# Patient Record
Sex: Female | Born: 1960 | Race: Black or African American | Hispanic: No | Smoking: Never smoker
Health system: Southern US, Community
[De-identification: ages and names within clinical notes are randomized; demographics above are authoritative.]

## PROBLEM LIST (undated history)

## (undated) DIAGNOSIS — I10 Essential (primary) hypertension: Secondary | ICD-10-CM

## (undated) DIAGNOSIS — E079 Disorder of thyroid, unspecified: Secondary | ICD-10-CM

## (undated) DIAGNOSIS — E119 Type 2 diabetes mellitus without complications: Secondary | ICD-10-CM

## (undated) DIAGNOSIS — C569 Malignant neoplasm of unspecified ovary: Secondary | ICD-10-CM

## (undated) HISTORY — PX: ABDOMINAL SURGERY: SHX537

## (undated) HISTORY — PX: HERNIA REPAIR: SHX51

## (undated) HISTORY — PX: ABDOMINAL HYSTERECTOMY: SHX81

---

## 2015-09-22 ENCOUNTER — Emergency Department (HOSPITAL_COMMUNITY): Payer: Medicare Other

## 2015-09-22 ENCOUNTER — Encounter (HOSPITAL_COMMUNITY): Payer: Self-pay | Admitting: Emergency Medicine

## 2015-09-22 ENCOUNTER — Observation Stay (HOSPITAL_COMMUNITY)
Admission: EM | Admit: 2015-09-22 | Discharge: 2015-09-23 | Discharge status: Against Medical Advice | Payer: Medicare Other | Attending: Internal Medicine | Admitting: Internal Medicine

## 2015-09-22 DIAGNOSIS — C569 Malignant neoplasm of unspecified ovary: Secondary | ICD-10-CM | POA: Diagnosis not present

## 2015-09-22 DIAGNOSIS — E079 Disorder of thyroid, unspecified: Secondary | ICD-10-CM | POA: Diagnosis not present

## 2015-09-22 DIAGNOSIS — K5669 Other intestinal obstruction: Secondary | ICD-10-CM | POA: Diagnosis not present

## 2015-09-22 DIAGNOSIS — D649 Anemia, unspecified: Secondary | ICD-10-CM | POA: Insufficient documentation

## 2015-09-22 DIAGNOSIS — E119 Type 2 diabetes mellitus without complications: Secondary | ICD-10-CM | POA: Diagnosis not present

## 2015-09-22 DIAGNOSIS — E785 Hyperlipidemia, unspecified: Secondary | ICD-10-CM

## 2015-09-22 DIAGNOSIS — R110 Nausea: Secondary | ICD-10-CM

## 2015-09-22 DIAGNOSIS — Z9221 Personal history of antineoplastic chemotherapy: Secondary | ICD-10-CM | POA: Insufficient documentation

## 2015-09-22 DIAGNOSIS — K5660 Unspecified intestinal obstruction: Principal | ICD-10-CM | POA: Insufficient documentation

## 2015-09-22 DIAGNOSIS — I10 Essential (primary) hypertension: Secondary | ICD-10-CM | POA: Diagnosis not present

## 2015-09-22 DIAGNOSIS — E871 Hypo-osmolality and hyponatremia: Secondary | ICD-10-CM | POA: Insufficient documentation

## 2015-09-22 DIAGNOSIS — Z79899 Other long term (current) drug therapy: Secondary | ICD-10-CM | POA: Insufficient documentation

## 2015-09-22 DIAGNOSIS — R1013 Epigastric pain: Secondary | ICD-10-CM

## 2015-09-22 DIAGNOSIS — K56609 Unspecified intestinal obstruction, unspecified as to partial versus complete obstruction: Secondary | ICD-10-CM | POA: Diagnosis present

## 2015-09-22 HISTORY — DX: Disorder of thyroid, unspecified: E07.9

## 2015-09-22 HISTORY — DX: Type 2 diabetes mellitus without complications: E11.9

## 2015-09-22 HISTORY — DX: Essential (primary) hypertension: I10

## 2015-09-22 HISTORY — DX: Malignant neoplasm of unspecified ovary: C56.9

## 2015-09-22 LAB — COMPREHENSIVE METABOLIC PANEL
ALK PHOS: 76 U/L (ref 38–126)
ALT: 17 U/L (ref 14–54)
ANION GAP: 9 (ref 5–15)
AST: 21 U/L (ref 15–41)
Albumin: 3.9 g/dL (ref 3.5–5.0)
BUN: 12 mg/dL (ref 6–20)
CALCIUM: 9.3 mg/dL (ref 8.9–10.3)
CO2: 28 mmol/L (ref 22–32)
CREATININE: 0.84 mg/dL (ref 0.44–1.00)
Chloride: 95 mmol/L — ABNORMAL LOW (ref 101–111)
Glucose, Bld: 264 mg/dL — ABNORMAL HIGH (ref 65–99)
Potassium: 4 mmol/L (ref 3.5–5.1)
SODIUM: 132 mmol/L — AB (ref 135–145)
TOTAL PROTEIN: 7.5 g/dL (ref 6.5–8.1)
Total Bilirubin: 0.4 mg/dL (ref 0.3–1.2)

## 2015-09-22 LAB — URINALYSIS, ROUTINE W REFLEX MICROSCOPIC
Bilirubin Urine: NEGATIVE
Glucose, UA: >1000 mg/dL — AB
HGB URINE DIPSTICK: NEGATIVE
Ketones, ur: 15 mg/dL — AB
LEUKOCYTES UA: NEGATIVE
NITRITE: NEGATIVE
PROTEIN: NEGATIVE mg/dL
SPECIFIC GRAVITY, URINE: 1.021 (ref 1.005–1.030)
pH: 6 (ref 5.0–8.0)

## 2015-09-22 LAB — CBC
HCT: 28.5 % — ABNORMAL LOW (ref 36.0–46.0)
HEMOGLOBIN: 9.4 g/dL — AB (ref 12.0–15.0)
MCH: 28.1 pg (ref 26.0–34.0)
MCHC: 33 g/dL (ref 30.0–36.0)
MCV: 85.1 fL (ref 78.0–100.0)
PLATELETS: 387 10*3/uL (ref 150–400)
RBC: 3.35 MIL/uL — AB (ref 3.87–5.11)
RDW: 14.8 % (ref 11.5–15.5)
WBC: 10.3 10*3/uL (ref 4.0–10.5)

## 2015-09-22 LAB — URINE MICROSCOPIC-ADD ON: RBC / HPF: NONE SEEN RBC/hpf (ref 0–5)

## 2015-09-22 LAB — LIPASE, BLOOD: LIPASE: 14 U/L (ref 11–51)

## 2015-09-22 LAB — GLUCOSE, CAPILLARY: Glucose-Capillary: 199 mg/dL — ABNORMAL HIGH (ref 65–99)

## 2015-09-22 LAB — TROPONIN I

## 2015-09-22 MED ORDER — SODIUM CHLORIDE 0.9 % IV BOLUS (SEPSIS)
1000.0000 mL | Freq: Once | INTRAVENOUS | Status: AC
  Administered 2015-09-22: 1000 mL via INTRAVENOUS

## 2015-09-22 MED ORDER — PROMETHAZINE HCL 25 MG/ML IJ SOLN
25.0000 mg | Freq: Once | INTRAMUSCULAR | Status: AC
  Administered 2015-09-22: 25 mg via INTRAVENOUS
  Filled 2015-09-22: qty 1

## 2015-09-22 MED ORDER — PROMETHAZINE HCL 25 MG/ML IJ SOLN
12.5000 mg | Freq: Four times a day (QID) | INTRAMUSCULAR | Status: DC | PRN
  Administered 2015-09-22: 12.5 mg via INTRAVENOUS
  Filled 2015-09-22: qty 1

## 2015-09-22 MED ORDER — INSULIN ASPART 100 UNIT/ML ~~LOC~~ SOLN: 0.0000 [IU] | Freq: Three times a day (TID) | SUBCUTANEOUS | Status: DC

## 2015-09-22 MED ORDER — ONDANSETRON 8 MG PO TBDP
8.0000 mg | ORAL_TABLET | Freq: Once | ORAL | Status: AC
  Administered 2015-09-22: 8 mg via ORAL
  Filled 2015-09-22: qty 1

## 2015-09-22 MED ORDER — HYDROMORPHONE HCL 1 MG/ML IJ SOLN
0.5000 mg | INTRAMUSCULAR | Status: DC | PRN
  Administered 2015-09-23: 1 mg via INTRAVENOUS
  Filled 2015-09-22: qty 1

## 2015-09-22 MED ORDER — KETOROLAC TROMETHAMINE 15 MG/ML IJ SOLN
15.0000 mg | Freq: Four times a day (QID) | INTRAMUSCULAR | Status: DC | PRN
  Administered 2015-09-22 – 2015-09-23 (×2): 15 mg via INTRAVENOUS
  Filled 2015-09-22 (×2): qty 1

## 2015-09-22 MED ORDER — ENOXAPARIN SODIUM 40 MG/0.4ML ~~LOC~~ SOLN
40.0000 mg | SUBCUTANEOUS | Status: DC
  Administered 2015-09-22: 40 mg via SUBCUTANEOUS
  Filled 2015-09-22: qty 0.4

## 2015-09-22 MED ORDER — DIATRIZOATE MEGLUMINE & SODIUM 66-10 % PO SOLN
15.0000 mL | Freq: Once | ORAL | Status: AC
  Administered 2015-09-22: 15 mL via ORAL

## 2015-09-22 MED ORDER — SODIUM CHLORIDE 0.9% FLUSH: 10.0000 mL | INTRAVENOUS | Status: DC | PRN

## 2015-09-22 MED ORDER — IOPAMIDOL (ISOVUE-300) INJECTION 61%: 100.0000 mL | Freq: Once | INTRAVENOUS | Status: DC | PRN

## 2015-09-22 MED ORDER — HYDROMORPHONE HCL 1 MG/ML IJ SOLN
1.0000 mg | Freq: Once | INTRAMUSCULAR | Status: AC
  Administered 2015-09-22: 1 mg via INTRAVENOUS
  Filled 2015-09-22: qty 1

## 2015-09-22 MED ORDER — MORPHINE SULFATE (PF) 4 MG/ML IV SOLN
4.0000 mg | Freq: Once | INTRAVENOUS | Status: AC
  Administered 2015-09-22: 4 mg via INTRAVENOUS
  Filled 2015-09-22: qty 1

## 2015-09-22 MED ORDER — FENTANYL CITRATE (PF) 100 MCG/2ML IJ SOLN
50.0000 ug | INTRAMUSCULAR | Status: DC | PRN
  Administered 2015-09-22: 50 ug via NASAL
  Filled 2015-09-22: qty 2

## 2015-09-22 MED ORDER — SODIUM CHLORIDE 0.9 % IV SOLN
INTRAVENOUS | Status: DC
  Administered 2015-09-22 – 2015-09-23 (×2): via INTRAVENOUS

## 2015-09-22 MED ORDER — METOCLOPRAMIDE HCL 5 MG/ML IJ SOLN
10.0000 mg | Freq: Once | INTRAMUSCULAR | Status: AC
  Administered 2015-09-22: 10 mg via INTRAVENOUS
  Filled 2015-09-22: qty 2

## 2015-09-22 NOTE — ED Notes (Signed)
Patient transported to CT 

## 2015-09-22 NOTE — ED Provider Notes (Signed)
[facility] DEPT Provider Note   CSN: [id number] Arrival date & time: 09/22/15  0408     History   Chief Complaint Chief Complaint  Patient presents with  . Abdominal Pain    HPI Danielle Flowers is a 55 y.o. female.  HPI   Danielle Flowers is a 55 y.o. female, with a history of ovarian cancer, DM, and hypertension, presenting to the ED with epigastric abdominal pain beginning around 3 AM this morning. Pain is cramping "like labor pains," rated 9/10, nonradiating. Pt also endorses nausea and two episodes of emesis over the last 24 hours. Has tried maalox with no relief. Last BM yesterday. Pt endorses two yesterday, one normal and then one that was watery. Pt states she has had similar pain and symptoms before, and has had a previous bowel obstruction. Pt has not had a BM since the pain began and fears she may be obstructed. Denies fever/chills, hematochezia/melena, chest pain, shortness of breath, or any other complaints. Pt has stage III ovarian cancer and is receiving chemo therapy with last treatment in March 2017. Patient is visiting here from [place] for a convention.  Past Medical History:  Diagnosis Date  . Diabetes mellitus without complication ([place])   . Hypertension   . Ovarian cancer ([place])   . Thyroid disease     Patient Active Problem List   Diagnosis Date Noted  . Small bowel obstruction ([place]) 09/22/2015  . Diabetes mellitus ([place]) 09/22/2015  . Ovarian cancer ([place]) 09/22/2015  . Hyperlipidemia 09/22/2015    Past Surgical History:  Procedure Laterality Date  . ABDOMINAL HYSTERECTOMY    . ABDOMINAL SURGERY    . HERNIA REPAIR      OB History    No data available       Home Medications    Prior to Admission medications   Medication Sig Start Date End Date Taking? Authorizing Provider  alum & mag hydroxide-simeth (MAALOX/MYLANTA) 200-200-20 MG/5ML suspension Take 30 mLs by mouth every 6 (six) hours as needed for indigestion or heartburn.   Yes Historical  Provider, MD  HYDROcodone-acetaminophen (NORCO) 10-325 MG tablet Take 1 tablet by mouth as needed. 09/09/15  Yes Historical Provider, MD  pravastatin (PRAVACHOL) 40 MG tablet Take 1 tablet by mouth daily. 06/15/15  Yes Historical Provider, MD  promethazine (PHENERGAN) 12.5 MG tablet Take 1 tablet by mouth every 8 (eight) hours as needed. 09/02/15  Yes Historical Provider, MD  ranitidine (ZANTAC) 150 MG tablet Take 1 tablet by mouth 2 (two) times daily. 08/23/15  Yes Historical Provider, MD  sucralfate (CARAFATE) 1 g tablet Take 1 tablet by mouth 4 (four) times daily. 09/17/15  Yes Historical Provider, MD    Family History History reviewed. No pertinent family history.  Social History Social History  Substance Use Topics  . Smoking status: Never Smoker  . Smokeless tobacco: Never Used  . Alcohol use No     Allergies   Review of patient's allergies indicates no known allergies.   Review of Systems Review of Systems  Constitutional: Negative for chills, diaphoresis and fever.  Respiratory: Negative for shortness of breath.   Cardiovascular: Negative for chest pain.  Gastrointestinal: Positive for abdominal pain, diarrhea, nausea and vomiting. Negative for blood in stool.  Genitourinary: Negative for dysuria and hematuria.  All other systems reviewed and are negative.    Physical Exam Updated Vital Signs BP 136/83 (BP Location: Left Arm)   Pulse 110   Temp 98.8 F (37.1 C) (Oral)   Resp 18  Ht 5\' 7"  (1.702 m)   Wt 95.3 kg   SpO2 96%   BMI 32.89 kg/m   Physical Exam  Constitutional: She appears well-developed and well-nourished. No distress.  HENT:  Head: Normocephalic and atraumatic.  Eyes: Conjunctivae are normal.  Neck: Neck supple.  Cardiovascular: Normal rate, regular rhythm, normal heart sounds and intact distal pulses.   Pulmonary/Chest: Effort normal and breath sounds normal. No respiratory distress.  Abdominal: Soft. She exhibits no distension. Bowel sounds are  decreased. There is tenderness in the epigastric area. There is no guarding.    Musculoskeletal: She exhibits no edema or tenderness.  Lymphadenopathy:    She has no cervical adenopathy.  Neurological: She is alert.  Skin: Skin is warm and dry. She is not diaphoretic.  Psychiatric: She has a normal mood and affect. Her behavior is normal.  Nursing note and vitals reviewed.    ED Treatments / Results  Labs (all labs ordered are listed, but only abnormal results are displayed) Labs Reviewed  COMPREHENSIVE METABOLIC PANEL - Abnormal; Notable for the following:       Result Value   Sodium 132 (*)    Chloride 95 (*)    Glucose, Bld 264 (*)    All other components within normal limits  CBC - Abnormal; Notable for the following:    RBC 3.35 (*)    Hemoglobin 9.4 (*)    HCT 28.5 (*)    All other components within normal limits  URINALYSIS, ROUTINE W REFLEX MICROSCOPIC (NOT AT [facility]) - Abnormal; Notable for the following:    Glucose, UA >1000 (*)    Ketones, ur 15 (*)    All other components within normal limits  URINE MICROSCOPIC-ADD ON - Abnormal; Notable for the following:    Squamous Epithelial / LPF 0-5 (*)    Bacteria, UA RARE (*)    All other components within normal limits  LIPASE, BLOOD  TROPONIN I    EKG  EKG Interpretation  Date/Time:  Wednesday September 22 2015 06:41:31 EDT Ventricular Rate:  103 PR Interval:    QRS Duration: 85 QT Interval:  326 QTC Calculation: 427 R Axis:   34 Text Interpretation:  Sinus tachycardia Abnormal R-wave progression, early transition No old tracing to compare Confirmed by Glynn Octave [phone]) on 09/22/2015 6:53:16 AM       Radiology Ct Abdomen Pelvis Wo Contrast  Result Date: 09/22/2015 CLINICAL DATA:  Epigastric pain since 3 p.m. yesterday with 2 episodes of vomiting. History of ovarian cancer. EXAM: CT ABDOMEN AND PELVIS WITHOUT CONTRAST TECHNIQUE: Multidetector CT imaging of the abdomen and pelvis was performed  following the standard protocol without IV contrast. COMPARISON:  None. FINDINGS: Lower chest:  Unremarkable. Hepatobiliary: No focal abnormality in the liver on this study without intravenous contrast. Gallbladder surgically absent. No intrahepatic or extrahepatic biliary dilation. Pancreas: No focal mass lesion. No dilatation of the main duct. No intraparenchymal cyst. No peripancreatic edema. Spleen: No splenomegaly. No focal mass lesion. Adrenals/Urinary Tract: No adrenal nodule or mass. Noncontrast appearance of the kidneys is unremarkable. No hydronephrosis. No evidence for hydroureter. The urinary bladder appears normal for the degree of distention. Stomach/Bowel: Stomach is distended with fluid. Small bowel loops of the abdomen and pelvis are fluid-filled and distended up to 3.8 cm diameter. Multiple small bowel adhesions are evident. There is some small bowel on the right lower quadrant but is decompressed. Small bowel anastomosis identified in the anterior right lower quadrant. Colon is nondilated in a suture line  is visible in the colon near the rectosigmoid junction. Vascular/Lymphatic: There is abdominal aortic atherosclerosis without aneurysm. There is no gastrohepatic or hepatoduodenal ligament lymphadenopathy. No intraperitoneal or retroperitoneal lymphadenopathy. No pelvic sidewall lymphadenopathy. Reproductive: Uterus is surgically absent.  No adnexal mass. Other: Abnormal soft tissue nodularity is identified in the gastrocolic ligament and omentum. 4.9 x 1.5 cm irregular soft tissue lesion is identified in the gastrocolic ligament on image 31 series 2. Right omental nodules are visible on image 42. Abnormal omental soft tissue is seen in the left upper quadrant on images 26-31. Prominent lymphadenopathy is seen in the central mesentery (image 50 series 2) with a 19 mm short axis lymph node visible. The irregular soft tissue attenuation along the peritoneal of the cul-de-sac is evident. A small  volume of intraperitoneal free fluid is evident. Musculoskeletal: Bone windows reveal no worrisome lytic or sclerotic osseous lesions. IMPRESSION: 1. Fluid-filled dilated small bowel loops measure up to 3.8 cm in diameter, consistent with a component of underlying small-bowel obstruction. Although no discrete transition zone is identified, the patient has evidence of prior surgery and adhesions would be a consideration. (Please see 2 below) 2. Prominent abnormal soft tissue in the gastrocolic ligament, omentum, peritoneal lining, and in the central mesentery consistent with metastatic disease in this patient with a history of ovarian cancer. As such, metastatic involvement of small bowel as in etiology for bowel obstruction is also a consideration. 3. Abdominal aortic atherosclerosis. Electronically Signed   By: Misty Stanley M.D.   On: 09/22/2015 12:18   Dg Abdomen Acute W/chest  Result Date: 09/22/2015 CLINICAL DATA:  55 y/o F; sudden onset of left upper quadrant pain this morning with nausea and vomiting. History of ovarian cancer. EXAM: DG ABDOMEN ACUTE W/ 1V CHEST COMPARISON:  None. FINDINGS: No consolidation, pneumothorax, or pleural effusion. Right port catheter tip projects over mid SVC. Severe degenerative changes of the shoulder joints bilaterally. Partially visualized anterior cervical fusion hardware. Normal bowel gas pattern. Right upper quadrant surgical clips, presumably cholecystectomy. Clips in the right hemi abdomen, possibly related to history of hernia repair. No acute osseous abnormality is evident. No pneumoperitoneum. Nonspecific calcific density projecting over the left hilum, possibly a calcified lymph node. IMPRESSION: Negative abdominal radiographs. No acute cardiopulmonary disease. Nonspecific calcified density projecting over the left hilum, possibly a calcified lymph node. Electronically Signed   By: Kristine Garbe M.D.   On: 09/22/2015 05:52    Procedures Procedures  (including critical care time)  Medications Ordered in ED Medications  fentaNYL (SUBLIMAZE) injection 50 mcg (50 mcg Nasal Given 09/22/15 0534)  iopamidol (ISOVUE-300) 61 % injection 100 mL (100 mLs Intravenous Canceled Entry 09/22/15 0758)  promethazine (PHENERGAN) injection 12.5 mg (12.5 mg Intravenous Given 09/22/15 1336)  enoxaparin (LOVENOX) injection 40 mg (40 mg Subcutaneous Given 09/22/15 1608)  0.9 %  sodium chloride infusion ( Intravenous New Bag/Given 09/22/15 1335)  ketorolac (TORADOL) 15 MG/ML injection 15 mg (15 mg Intravenous Given 09/22/15 1607)  insulin aspart (novoLOG) injection 0-9 Units (not administered)  ondansetron (ZOFRAN-ODT) disintegrating tablet 8 mg (8 mg Oral Given 09/22/15 0533)  morphine 4 MG/ML injection 4 mg (4 mg Intravenous Given 09/22/15 0706)  sodium chloride 0.9 % bolus 1,000 mL (0 mLs Intravenous Stopped 09/22/15 1156)  metoCLOPramide (REGLAN) injection 10 mg (10 mg Intravenous Given 09/22/15 0706)  diatrizoate meglumine-sodium (GASTROGRAFIN) 66-10 % solution 15 mL (15 mLs Oral Given 09/22/15 0831)  HYDROmorphone (DILAUDID) injection 1 mg (1 mg Intravenous Given 09/22/15 0851)  promethazine (PHENERGAN)  injection 25 mg (25 mg Intravenous Given 09/22/15 0910)  HYDROmorphone (DILAUDID) injection 1 mg (1 mg Intravenous Given 09/22/15 1309)     Initial Impression / Assessment and Plan / ED Course  I have reviewed the triage vital signs and the nursing notes.  Pertinent labs & imaging results that were available during my care of the patient were reviewed by me and considered in my medical decision making (see chart for details).  Clinical Course  Value Comment By Time  Creatinine: 0.84 (Reviewed) Lorayne Bender, PA-C 08/23 [id number]    Ranee Gosselin presents with abdominal pain since 3 AM today.  Findings and plan of care discussed with Everlene Balls, MD and then with Davonna Belling, MD after EDP shift change.   Patient's presentation, combined with her history, is  suspicious for a bowel obstruction. Evidence of bowel obstruction found on CT as well as evidence for metastatic disease. This information was communicated with the patient. Admission for resolution of SBO. Patient is not currently vomiting, has not vomited during her time in the ED, and a NG tube does not appear to be indicated at this time.  See below for more detailed patient care timeline: 8:26 AM CT tech called and states that they are unsure whether the patient's port is a power port and could handle IV contrast. 8:28 AM Spoke with Dr. Dimas Chyle, radiologist, who states that as best he can tell the patient has a power port and contrast injection should be ok. Patient confirms that she has not had to drink contrast for her previous abdominal CTs and that they put contrast through her port. Upon reassessment, patient states her pain and nausea are well-controlled following the Dilaudid and Phenergan. 11:36 AM There was a delay in the patient's CT. Apparently the radiologist wanted more definitive confirmation of the patient's port type. They were trying to get the records from her home hospital in [place]. This information was not passed on to me. Once it was, the decision was made to perform the CT without contrast. 12:57 PM Spoke with Dr. Cruzita Lederer, Hospitalist, who agreed to admit the patient to [place] observation.   Vitals:   09/22/15 0900 09/22/15 0903 09/22/15 0930 09/22/15 1000  BP: 153/90 153/90 120/91 166/89  Pulse: 102 105 95 95  Resp: 18 18 10 12   Temp:      TempSrc:      SpO2: 95% 94% 94% 95%  Weight:      Height:       Vitals:   09/22/15 1330 09/22/15 1430 09/22/15 1500 09/22/15 1600  BP: 143/88 139/91 154/94 163/92  Pulse: 96 94 91 98  Resp: 10 11 11 14   Temp:      TempSrc:      SpO2: 100% 94% 94% 93%  Weight:      Height:          Final Clinical Impressions(s) / ED Diagnoses   Final diagnoses:  Epigastric pain  Nausea  Small bowel obstruction [facility])    New  Prescriptions New Prescriptions   No medications on file     Lorayne Bender, PA-C 09/22/15 [place], MD 09/22/15 2122

## 2015-09-22 NOTE — Progress Notes (Signed)
Pt confirms she is visiting from out town, [place] States She sees Dr Baltazar Najjar, oncologist Pt O2 Sat down to 84% when Cm entered the room- CM noted pt with her hand containing the Saturation device under her head By the time ED CM finished speaking with CM, her O2 Sat noted to be 96%

## 2015-09-22 NOTE — ED Notes (Signed)
PA at bedside.

## 2015-09-22 NOTE — H&P (Addendum)
History and Physical    Danielle Flowers [id number] DOB: 1960-03-29 DOA: 09/22/2015  PCP: No primary care provider on file.  Outpatient Specialists: in [place] Patient coming from: hotel  Chief Complaint: Abdominal pain, nausea and vomiting  HPI: Danielle Flowers is a 55 y.o. female with medical history significant of ovarian cancer, status post what sounds like major debulking surgery in September 2016, with ileostomy placement and with reversal in April 2017, diabetes, hypertension, comes to the hospital with complaints of abdominal pain, nausea and vomiting. Patient tells me she had an episode of small bowel obstruction about a week ago when she needed hospitalization for 1 day, resolved on its own without need for surgical intervention. She tells me that she underwent chemotherapy and finished in March 2017, she is supposed to see her oncologist soon for follow-up with repeat imaging as well as repeat blood work. Other than small bowel obstruction last week, she has never had a small bowel obstruction. She has no fever or chills, denies any chest pain, she is not short of breath. She has mild generalized abdominal pain, still has nausea but her vomiting has resolved in the ER.   ED Course: She has stable vital signs, she is afebrile, she is on room air, blood work remarkable for mild anemia with a hemoglobin of 9.4, mild hyponatremia with sodium of 132 and elevated glucose of 264. She underwent a CT scan of the abdomen and pelvis which showed small bowel obstruction without transition zone identified. TRH was asked for admission for small bowel obstruction.   Review of Systems: As per HPI otherwise 10 point review of systems negative.   Past Medical History:  Diagnosis Date  . Diabetes mellitus without complication ([place])   . Hypertension   . Ovarian cancer ([place])   . Thyroid disease     Past Surgical History:  Procedure Laterality Date  . ABDOMINAL HYSTERECTOMY    . ABDOMINAL SURGERY    .  HERNIA REPAIR       reports that she has never smoked. She has never used smokeless tobacco. She reports that she does not drink alcohol or use drugs.  No Known Allergies  History reviewed. No pertinent family history.  Prior to Admission medications   Medication Sig Start Date End Date Taking? Authorizing Provider  alum & mag hydroxide-simeth (MAALOX/MYLANTA) 200-200-20 MG/5ML suspension Take 30 mLs by mouth every 6 (six) hours as needed for indigestion or heartburn.   Yes Historical Provider, MD  HYDROcodone-acetaminophen (NORCO) 10-325 MG tablet Take 1 tablet by mouth as needed. 09/09/15  Yes Historical Provider, MD  pravastatin (PRAVACHOL) 40 MG tablet Take 1 tablet by mouth daily. 06/15/15  Yes Historical Provider, MD  promethazine (PHENERGAN) 12.5 MG tablet Take 1 tablet by mouth every 8 (eight) hours as needed. 09/02/15  Yes Historical Provider, MD  ranitidine (ZANTAC) 150 MG tablet Take 1 tablet by mouth 2 (two) times daily. 08/23/15  Yes Historical Provider, MD  sucralfate (CARAFATE) 1 g tablet Take 1 tablet by mouth 4 (four) times daily. 09/17/15  Yes Historical Provider, MD    Physical Exam: Vitals:   09/22/15 1000 09/22/15 1130 09/22/15 1300 09/22/15 1307  BP: 166/89 129/84  164/95  Pulse: 95 96 100 100  Resp: 12 12 11 12   Temp:      TempSrc:      SpO2: 95% 95% 96% 98%  Weight:      Height:          Constitutional: appears uncomfortable  Vitals:  09/22/15 1000 09/22/15 1130 09/22/15 1300 09/22/15 1307  BP: 166/89 129/84  164/95  Pulse: 95 96 100 100  Resp: 12 12 11 12   Temp:      TempSrc:      SpO2: 95% 95% 96% 98%  Weight:      Height:       Eyes: PERRL, lids and conjunctivae normal ENMT: Mucous membranes are moist.  Neck: normal, supple Respiratory: clear to auscultation bilaterally, no wheezing, no crackles.  Cardiovascular: Regular rate and rhythm, no murmurs / rubs / gallops. No extremity edema. 2+ pedal pulses. Abdomen: mild tenderness, abdomen soft, no  guarding. Bowel sounds decreased.  Musculoskeletal: no clubbing / cyanosis.  Skin: no rashes, lesions, ulcers. No induration Neurologic: non focal   Psychiatric: Normal judgment and insight. Alert and oriented x 3. Normal mood.   Labs on Admission: I have personally reviewed following labs and imaging studies  CBC:  Recent Labs Lab 09/22/15 0456  WBC 10.3  HGB 9.4*  HCT 28.5*  MCV 85.1  PLT [id number]   Basic Metabolic Panel:  Recent Labs Lab 09/22/15 0456  NA 132*  K 4.0  CL 95*  CO2 28  GLUCOSE 264*  BUN 12  CREATININE 0.84  CALCIUM 9.3   GFR: Estimated Creatinine Clearance: 88.7 mL/min (by C-G formula based on SCr of 0.84 mg/dL). Liver Function Tests:  Recent Labs Lab 09/22/15 0456  AST 21  ALT 17  ALKPHOS 76  BILITOT 0.4  PROT 7.5  ALBUMIN 3.9    Recent Labs Lab 09/22/15 0456  LIPASE 14   No results for input(s): AMMONIA in the last 168 hours. Coagulation Profile: No results for input(s): INR, PROTIME in the last 168 hours. Cardiac Enzymes:  Recent Labs Lab 09/22/15 0456  TROPONINI <0.03   BNP (last 3 results) No results for input(s): PROBNP in the last 8760 hours. HbA1C: No results for input(s): HGBA1C in the last 72 hours. CBG: No results for input(s): GLUCAP in the last 168 hours. Lipid Profile: No results for input(s): CHOL, HDL, LDLCALC, TRIG, CHOLHDL, LDLDIRECT in the last 72 hours. Thyroid Function Tests: No results for input(s): TSH, T4TOTAL, FREET4, T3FREE, THYROIDAB in the last 72 hours. Anemia Panel: No results for input(s): VITAMINB12, FOLATE, FERRITIN, TIBC, IRON, RETICCTPCT in the last 72 hours. Urine analysis:    Component Value Date/Time   COLORURINE YELLOW 09/22/2015 0842   APPEARANCEUR CLEAR 09/22/2015 0842   LABSPEC 1.021 09/22/2015 0842   PHURINE 6.0 09/22/2015 0842   GLUCOSEU >1000 (A) 09/22/2015 0842   HGBUR NEGATIVE 09/22/2015 0842   BILIRUBINUR NEGATIVE 09/22/2015 0842   KETONESUR 15 (A) 09/22/2015 0842    PROTEINUR NEGATIVE 09/22/2015 0842   NITRITE NEGATIVE 09/22/2015 0842   LEUKOCYTESUR NEGATIVE 09/22/2015 0842   Sepsis Labs: @LABRCNTIP (procalcitonin:4,lacticidven:4) )No results found for this or any previous visit (from the past 240 hour(s)).   Radiological Exams on Admission: Ct Abdomen Pelvis Wo Contrast  Result Date: 09/22/2015 CLINICAL DATA:  Epigastric pain since 3 p.m. yesterday with 2 episodes of vomiting. History of ovarian cancer. EXAM: CT ABDOMEN AND PELVIS WITHOUT CONTRAST TECHNIQUE: Multidetector CT imaging of the abdomen and pelvis was performed following the standard protocol without IV contrast. COMPARISON:  None. FINDINGS: Lower chest:  Unremarkable. Hepatobiliary: No focal abnormality in the liver on this study without intravenous contrast. Gallbladder surgically absent. No intrahepatic or extrahepatic biliary dilation. Pancreas: No focal mass lesion. No dilatation of the main duct. No intraparenchymal cyst. No peripancreatic edema. Spleen: No splenomegaly. No focal  mass lesion. Adrenals/Urinary Tract: No adrenal nodule or mass. Noncontrast appearance of the kidneys is unremarkable. No hydronephrosis. No evidence for hydroureter. The urinary bladder appears normal for the degree of distention. Stomach/Bowel: Stomach is distended with fluid. Small bowel loops of the abdomen and pelvis are fluid-filled and distended up to 3.8 cm diameter. Multiple small bowel adhesions are evident. There is some small bowel on the right lower quadrant but is decompressed. Small bowel anastomosis identified in the anterior right lower quadrant. Colon is nondilated in a suture line is visible in the colon near the rectosigmoid junction. Vascular/Lymphatic: There is abdominal aortic atherosclerosis without aneurysm. There is no gastrohepatic or hepatoduodenal ligament lymphadenopathy. No intraperitoneal or retroperitoneal lymphadenopathy. No pelvic sidewall lymphadenopathy. Reproductive: Uterus is surgically  absent.  No adnexal mass. Other: Abnormal soft tissue nodularity is identified in the gastrocolic ligament and omentum. 4.9 x 1.5 cm irregular soft tissue lesion is identified in the gastrocolic ligament on image 31 series 2. Right omental nodules are visible on image 42. Abnormal omental soft tissue is seen in the left upper quadrant on images 26-31. Prominent lymphadenopathy is seen in the central mesentery (image 50 series 2) with a 19 mm short axis lymph node visible. The irregular soft tissue attenuation along the peritoneal of the cul-de-sac is evident. A small volume of intraperitoneal free fluid is evident. Musculoskeletal: Bone windows reveal no worrisome lytic or sclerotic osseous lesions. IMPRESSION: 1. Fluid-filled dilated small bowel loops measure up to 3.8 cm in diameter, consistent with a component of underlying small-bowel obstruction. Although no discrete transition zone is identified, the patient has evidence of prior surgery and adhesions would be a consideration. (Please see 2 below) 2. Prominent abnormal soft tissue in the gastrocolic ligament, omentum, peritoneal lining, and in the central mesentery consistent with metastatic disease in this patient with a history of ovarian cancer. As such, metastatic involvement of small bowel as in etiology for bowel obstruction is also a consideration. 3. Abdominal aortic atherosclerosis. Electronically Signed   By: Misty Stanley M.D.   On: 09/22/2015 12:18   Dg Abdomen Acute W/chest  Result Date: 09/22/2015 CLINICAL DATA:  55 y/o F; sudden onset of left upper quadrant pain this morning with nausea and vomiting. History of ovarian cancer. EXAM: DG ABDOMEN ACUTE W/ 1V CHEST COMPARISON:  None. FINDINGS: No consolidation, pneumothorax, or pleural effusion. Right port catheter tip projects over mid SVC. Severe degenerative changes of the shoulder joints bilaterally. Partially visualized anterior cervical fusion hardware. Normal bowel gas pattern. Right  upper quadrant surgical clips, presumably cholecystectomy. Clips in the right hemi abdomen, possibly related to history of hernia repair. No acute osseous abnormality is evident. No pneumoperitoneum. Nonspecific calcific density projecting over the left hilum, possibly a calcified lymph node. IMPRESSION: Negative abdominal radiographs. No acute cardiopulmonary disease. Nonspecific calcified density projecting over the left hilum, possibly a calcified lymph node. Electronically Signed   By: Kristine Garbe M.D.   On: 09/22/2015 05:52    Assessment/Plan Active Problems:   Small bowel obstruction (HCC)   Diabetes mellitus ([place])   Ovarian cancer (HCC)   Hyperlipidemia   Small bowel obstruction - Admit patient to the hospital, conservative management, CT scan with probable metastatic disease which can be a cause for a small bowel obstruction in addition to potential adhesions - IV fluids, nothing by mouth, she is no longer vomiting and will hold off NG tube placement - Repeat abdominal x-ray in the morning - Hopefully this will resolve with conservative measures, if  it doesn't we'll need to involve general surgery. Patient has a plane to catch on Friday morning, may be able to be discharged tomorrow if her SBO is resolved by then  Ovarian cancer - It appears that she may have metastatic disease, she has follow-up with her primary oncologist in [place]   DM - SSI  Hyperlipidemia - Hold statin  Hyponatremia - Perhaps in the setting of dehydration, monitor after IV fluids  Anemia - Unknown baseline hemoglobin, likely related to the underlying malignancy, will see her oncologist as an outpatient - No evidence of bleeding currently   DVT prophylaxis: Lovenox  Code Status: Full code  Family Communication: No family at bedside  Disposition Plan: Admit to [place] called: None  Admission status: Obs   Marzetta Board, MD [facility] Pager [phone]  If  7PM-7AM, please contact night-coverage [email or web address] Password [email or web address]  09/22/2015, 1:30 PM

## 2015-09-22 NOTE — ED Notes (Signed)
Patient transported to X-ray 

## 2015-09-22 NOTE — ED Notes (Addendum)
16:59 PT CAN GO TO FLOOR.

## 2015-09-22 NOTE — ED Notes (Signed)
BP 173/102, HR 104 NSR, RR 16, CBG 245 in route.

## 2015-09-22 NOTE — ED Notes (Signed)
Bed: [id number] Expected date:  Expected time:  Means of arrival:  Comments: EMS 54 yo female abdominal pain x 12 hours-same 1 week ago/seen had SBO

## 2015-09-22 NOTE — Progress Notes (Signed)
She sees Dr. Bettey Mare Oncologist in [place], [facility], [address],  [place] [address] [phone]

## 2015-09-22 NOTE — ED Triage Notes (Signed)
Pt c/o epigastric pain onset 1500 yesterday with 2 episodes of emesis and no diarrhea. Pt states she has felt the same in the past with resolution after BM. Pt has had SBO in the past with 2 prior abd surgeries. Pt is in town for a convention

## 2015-09-22 NOTE — ED Notes (Addendum)
Confirmation of port placement done by CXR, port in R chest, 20G 1in huber needle used, immediate blood return, flushed easily. Labs drawn

## 2015-09-22 NOTE — ED Notes (Signed)
Pt brought in by EMS from [place] at [facility] for abdominal pain, nausea, and vomiting that started at 1500 yesterday afternoon.  Stated she experienced these same symptoms a week ago.  She was evaluated and told she might have a blockage and was able to relieve herself at the hospital during that visit.  Emesis x4 over the past 12 hours.  States she is having sharp abdominal pains.  Pt has an implanted port from hx of ovarian cancer.

## 2015-09-23 ENCOUNTER — Observation Stay (HOSPITAL_COMMUNITY): Payer: Medicare Other

## 2015-09-23 DIAGNOSIS — R110 Nausea: Secondary | ICD-10-CM

## 2015-09-23 DIAGNOSIS — K5669 Other intestinal obstruction: Secondary | ICD-10-CM | POA: Diagnosis not present

## 2015-09-23 DIAGNOSIS — K5660 Unspecified intestinal obstruction: Secondary | ICD-10-CM | POA: Diagnosis not present

## 2015-09-23 LAB — BASIC METABOLIC PANEL
ANION GAP: 4 — AB (ref 5–15)
BUN: 11 mg/dL (ref 6–20)
CHLORIDE: 104 mmol/L (ref 101–111)
CO2: 30 mmol/L (ref 22–32)
Calcium: 7.4 mg/dL — ABNORMAL LOW (ref 8.9–10.3)
Creatinine, Ser: 0.81 mg/dL (ref 0.44–1.00)
GFR calc non Af Amer: >60 mL/min (ref >60)
Glucose, Bld: 166 mg/dL — ABNORMAL HIGH (ref 65–99)
POTASSIUM: 3.8 mmol/L (ref 3.5–5.1)
SODIUM: 138 mmol/L (ref 135–145)

## 2015-09-23 LAB — GLUCOSE, CAPILLARY
Glucose-Capillary: 196 mg/dL — ABNORMAL HIGH (ref 65–99)
Glucose-Capillary: 147 mg/dL — ABNORMAL HIGH (ref 65–99)

## 2015-09-23 LAB — CBC
HEMATOCRIT: 24 % — AB (ref 36.0–46.0)
HEMOGLOBIN: 7.6 g/dL — AB (ref 12.0–15.0)
MCH: 27.8 pg (ref 26.0–34.0)
MCHC: 31.7 g/dL (ref 30.0–36.0)
MCV: 87.9 fL (ref 78.0–100.0)
PLATELETS: 302 10*3/uL (ref 150–400)
RBC: 2.73 MIL/uL — AB (ref 3.87–5.11)
RDW: 15.3 % (ref 11.5–15.5)
WBC: 5 10*3/uL (ref 4.0–10.5)

## 2015-09-23 MED ORDER — HEPARIN SOD (PORK) LOCK FLUSH 100 UNIT/ML IV SOLN: 500.0000 [IU] | INTRAVENOUS | Status: DC | PRN

## 2015-09-23 NOTE — Care Management Obs Status (Signed)
[place] NOTIFICATION   Patient Details  Name: Danielle Flowers MRN: [id number] Date of Birth: 02-20-1960   Medicare Observation Status Notification Given:  Yes    Guadalupe Maple, RN 09/23/2015, 9:50 AM

## 2015-09-23 NOTE — Progress Notes (Signed)
09/23/15  1707  Patient left AMA. Patient did not want to sign AMA form. Patient walked out with daughter. Per patient she has a flight to catch to get home to family in [place]. Pt states MD stated he would d/c her at 3pm and then changed his mind. MD ordered stat KUB. Radiology came to room to do KUB, but patient refused stating she was leaving. Per pt she has had two BM since last xray was completed and feels better. MD notified that patient was leaving, but has not responded. IV team came to deaccess port.

## 2015-09-23 NOTE — Discharge Summary (Signed)
Physician Discharge Summary  Danielle Flowers [id number] DOB: 1960-03-09 DOA: 09/22/2015  PCP: No primary care provider on file.  Admit date: 09/22/2015 Discharge date: 09/23/2015  Patient left the [facility] AMA. Patient refused to sign the AMA form.  Discharge Diagnoses:  Active Problems:   Small bowel obstruction (HCC)   Diabetes mellitus (Danielle Flowers)   Ovarian cancer (Danielle Flowers)   Hyperlipidemia   History of present illness: See H and P done earlier today.  Hospital Course: Admitted with small bowel obstruction. Patient left the hospital against medical advice.  Procedures:  None  Consultations:  None  Discharge Exam: Vitals:   09/23/15 0614 09/23/15 1449  BP: 134/66 (!) 144/73  Pulse: 85 85  Resp: 16 15  Temp: 99 F (37.2 C) 98.8 F (37.1 C)     No Known Allergies    The results of significant diagnostics from this hospitalization (including imaging, microbiology, ancillary and laboratory) are listed below for reference.    Significant Diagnostic Studies: Ct Abdomen Pelvis Wo Contrast  Result Date: 09/22/2015 CLINICAL DATA:  Epigastric pain since 3 p.m. yesterday with 2 episodes of vomiting. History of ovarian cancer. EXAM: CT ABDOMEN AND PELVIS WITHOUT CONTRAST TECHNIQUE: Multidetector CT imaging of the abdomen and pelvis was performed following the standard protocol without IV contrast. COMPARISON:  None. FINDINGS: Lower chest:  Unremarkable. Hepatobiliary: No focal abnormality in the liver on this study without intravenous contrast. Gallbladder surgically absent. No intrahepatic or extrahepatic biliary dilation. Pancreas: No focal mass lesion. No dilatation of the main duct. No intraparenchymal cyst. No peripancreatic edema. Spleen: No splenomegaly. No focal mass lesion. Adrenals/Urinary Tract: No adrenal nodule or mass. Noncontrast appearance of the kidneys is unremarkable. No hydronephrosis. No evidence for hydroureter. The urinary bladder appears normal for the degree of  distention. Stomach/Bowel: Stomach is distended with fluid. Small bowel loops of the abdomen and pelvis are fluid-filled and distended up to 3.8 cm diameter. Multiple small bowel adhesions are evident. There is some small bowel on the right lower quadrant but is decompressed. Small bowel anastomosis identified in the anterior right lower quadrant. Colon is nondilated in a suture line is visible in the colon near the rectosigmoid junction. Vascular/Lymphatic: There is abdominal aortic atherosclerosis without aneurysm. There is no gastrohepatic or hepatoduodenal ligament lymphadenopathy. No intraperitoneal or retroperitoneal lymphadenopathy. No pelvic sidewall lymphadenopathy. Reproductive: Uterus is surgically absent.  No adnexal mass. Other: Abnormal soft tissue nodularity is identified in the gastrocolic ligament and omentum. 4.9 x 1.5 cm irregular soft tissue lesion is identified in the gastrocolic ligament on image 31 series 2. Right omental nodules are visible on image 42. Abnormal omental soft tissue is seen in the left upper quadrant on images 26-31. Prominent lymphadenopathy is seen in the central mesentery (image 50 series 2) with a 19 mm short axis lymph node visible. The irregular soft tissue attenuation along the peritoneal of the cul-de-sac is evident. A small volume of intraperitoneal free fluid is evident. Musculoskeletal: Bone windows reveal no worrisome lytic or sclerotic osseous lesions. IMPRESSION: 1. Fluid-filled dilated small bowel loops measure up to 3.8 cm in diameter, consistent with a component of underlying small-bowel obstruction. Although no discrete transition zone is identified, the patient has evidence of prior surgery and adhesions would be a consideration. (Please see 2 below) 2. Prominent abnormal soft tissue in the gastrocolic ligament, omentum, peritoneal lining, and in the central mesentery consistent with metastatic disease in this patient with a history of ovarian cancer. As  such, metastatic involvement of small bowel as  in etiology for bowel obstruction is also a consideration. 3. Abdominal aortic atherosclerosis. Electronically Signed   By: Misty Stanley M.D.   On: 09/22/2015 12:18   Acute Abdominal Series  Result Date: 09/23/2015 CLINICAL DATA:  Abdominal pain, history of ovarian malignancy. EXAM: DG ABDOMEN ACUTE W/ 1V CHEST COMPARISON:  Abdominal CT scan of September 22, 2015 FINDINGS: Chest x-ray: The lungs are adequately inflated and clear. The heart and pulmonary vascularity are normal. The power port appliance catheter tip projects over the proximal SVC. There is no pleural effusion or pneumothorax. There is moderate degenerative change of both shoulders. Within the abdomen the colonic stool burden is moderate. There is mild distention of a loop of small bowel in the mid abdomen with several adjacent small bowel air-fluid levels. There surgical clips in the gallbladder fossa. There are surgical clips overlying the right iliac crest and in the right aspect of the true bony pelvis. There is surgical suture in the mid pelvis. There are numerous pelvic phleboliths. The lumbar spine and bony pelvis exhibit no acute abnormalities. IMPRESSION: 1. No acute cardiopulmonary abnormality. 2. Partial mid to distal small bowel obstruction. There is no evidence of perforation. 3. Increased colonic stool burden consistent with constipation in the appropriate clinical setting. This is stable. Electronically Signed   By: David  Martinique M.D.   On: 09/23/2015 08:33   Dg Abdomen Acute W/chest  Result Date: 09/22/2015 CLINICAL DATA:  55 y/o F; sudden onset of left upper quadrant pain this morning with nausea and vomiting. History of ovarian cancer. EXAM: DG ABDOMEN ACUTE W/ 1V CHEST COMPARISON:  None. FINDINGS: No consolidation, pneumothorax, or pleural effusion. Right port catheter tip projects over mid SVC. Severe degenerative changes of the shoulder joints bilaterally. Partially visualized  anterior cervical fusion hardware. Normal bowel gas pattern. Right upper quadrant surgical clips, presumably cholecystectomy. Clips in the right hemi abdomen, possibly related to history of hernia repair. No acute osseous abnormality is evident. No pneumoperitoneum. Nonspecific calcific density projecting over the left hilum, possibly a calcified lymph node. IMPRESSION: Negative abdominal radiographs. No acute cardiopulmonary disease. Nonspecific calcified density projecting over the left hilum, possibly a calcified lymph node. Electronically Signed   By: Kristine Garbe M.D.   On: 09/22/2015 05:52    Microbiology: No results found for this or any previous visit (from the past 240 hour(s)).   Labs: Basic Metabolic Panel:  Recent Labs Lab 09/22/15 0456 09/23/15 0507  NA 132* 138  K 4.0 3.8  CL 95* 104  CO2 28 30  GLUCOSE 264* 166*  BUN 12 11  CREATININE 0.84 0.81  CALCIUM 9.3 7.4*   Liver Function Tests:  Recent Labs Lab 09/22/15 0456  AST 21  ALT 17  ALKPHOS 76  BILITOT 0.4  PROT 7.5  ALBUMIN 3.9    Recent Labs Lab 09/22/15 0456  LIPASE 14   No results for input(s): AMMONIA in the last 168 hours. CBC:  Recent Labs Lab 09/22/15 0456 09/23/15 0507  WBC 10.3 5.0  HGB 9.4* 7.6*  HCT 28.5* 24.0*  MCV 85.1 87.9  PLT 387 302   Cardiac Enzymes:  Recent Labs Lab 09/22/15 0456  TROPONINI <0.03   BNP: BNP (last 3 results) No results for input(s): BNP in the last 8760 hours.  ProBNP (last 3 results) No results for input(s): PROBNP in the last 8760 hours.  CBG:  Recent Labs Lab 09/22/15 2157 09/23/15 0748 09/23/15 1158  GLUCAP 199* 196* 147*       Signed:  Dana Allan, MD  [facility] Pager #: [phone] 7PM-7AM contact night coverage as above

## 2015-10-22 DIAGNOSIS — R110 Nausea: Secondary | ICD-10-CM

## 2016-03-30 DEATH — deceased

## 2017-02-17 IMAGING — DX DG ABDOMEN ACUTE W/ 1V CHEST
4 series · 4 of 4 positions shown · non-contrast
Comparison: Abdominal CT scan September 22, 2015

CLINICAL DATA: Abdominal pain, history of ovarian malignancy.

EXAM:
DG ABDOMEN ACUTE W/ 1V CHEST

[chest pa]
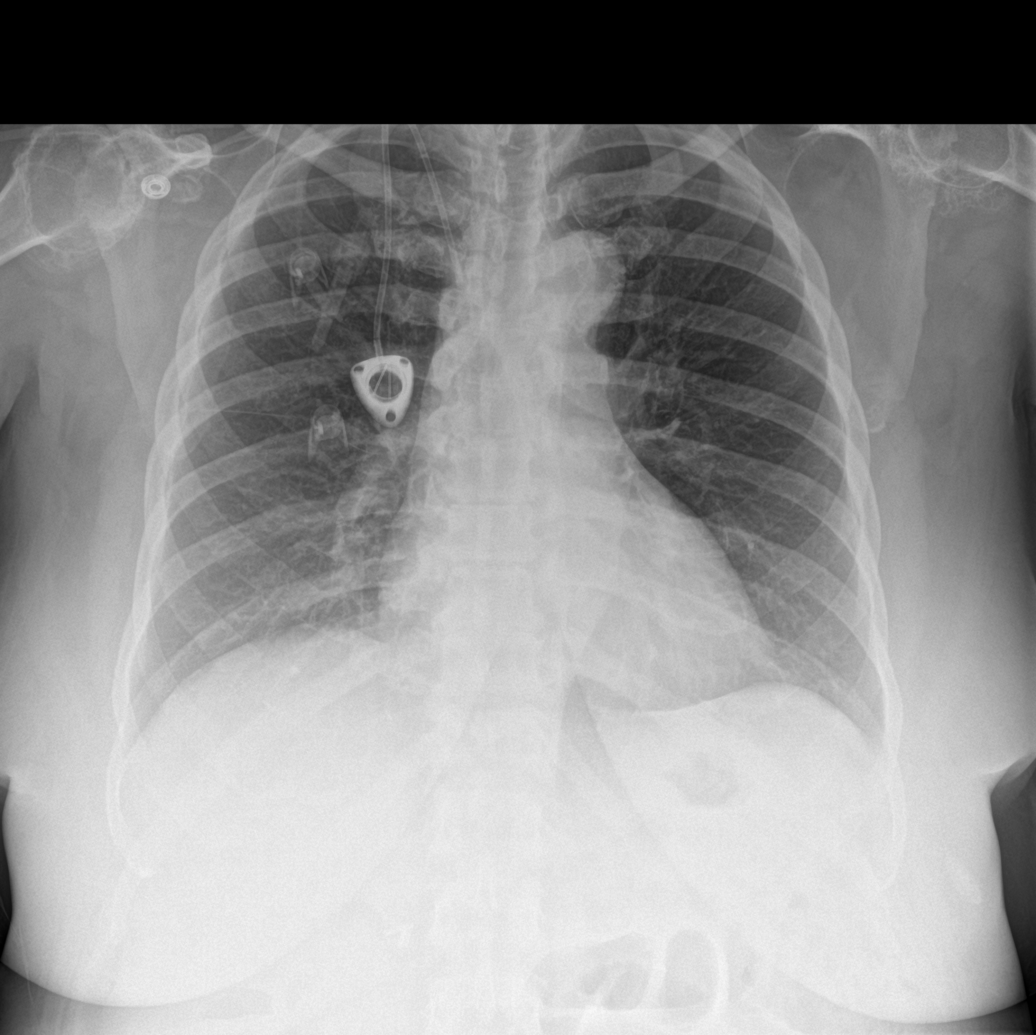

[abdomen erect (1 of 2)]
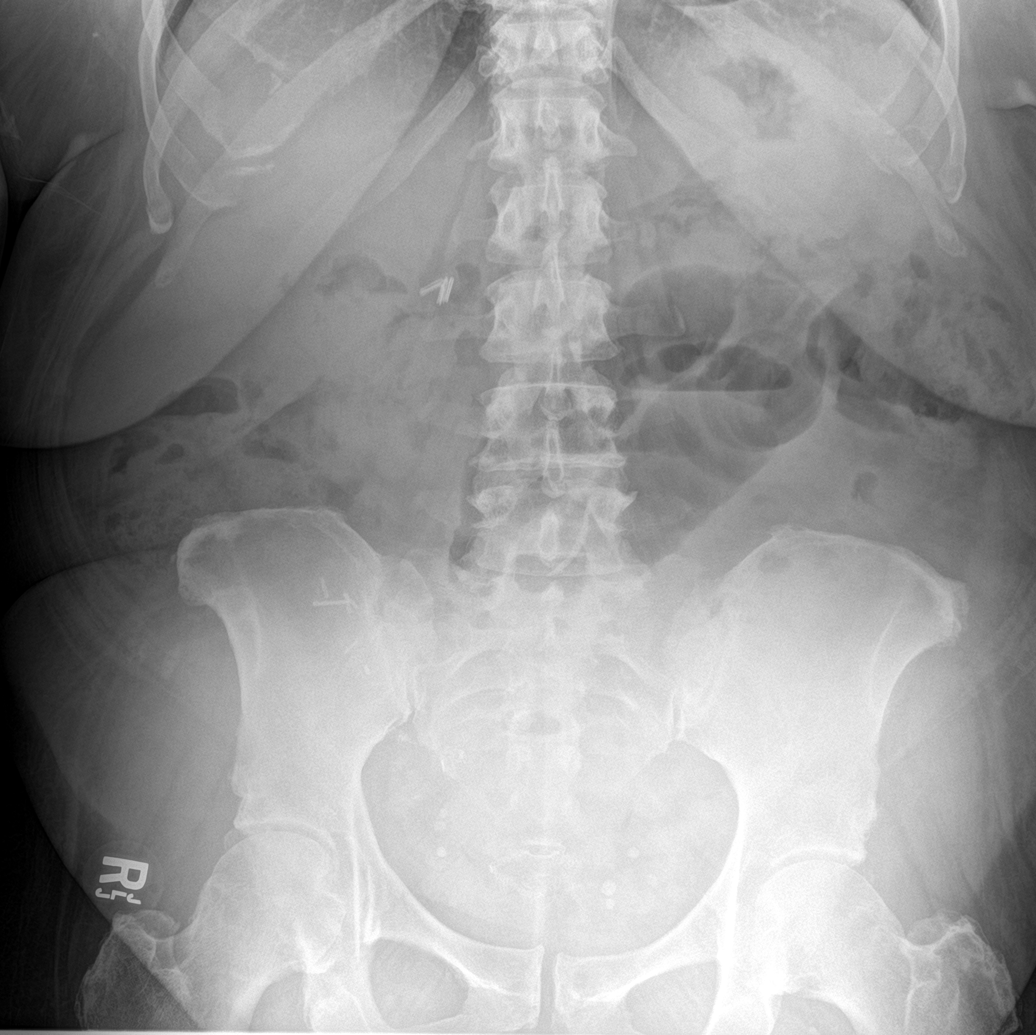

[abdomen supine]
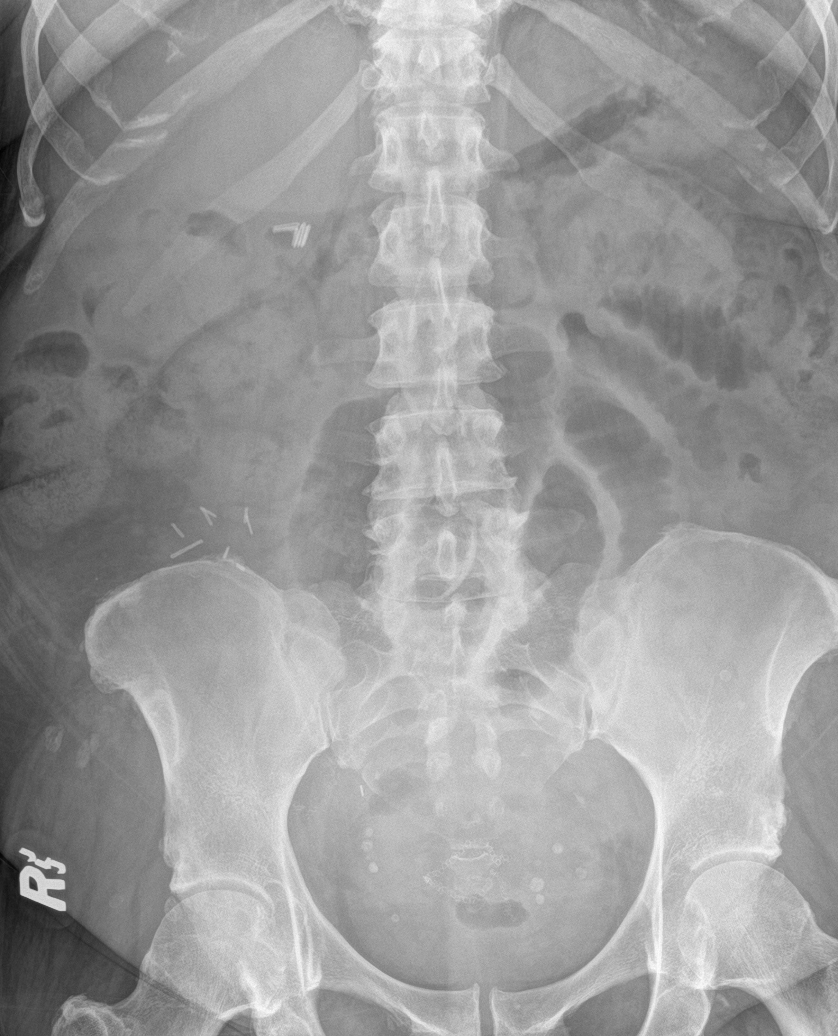

[abdomen erect (2 of 2)]
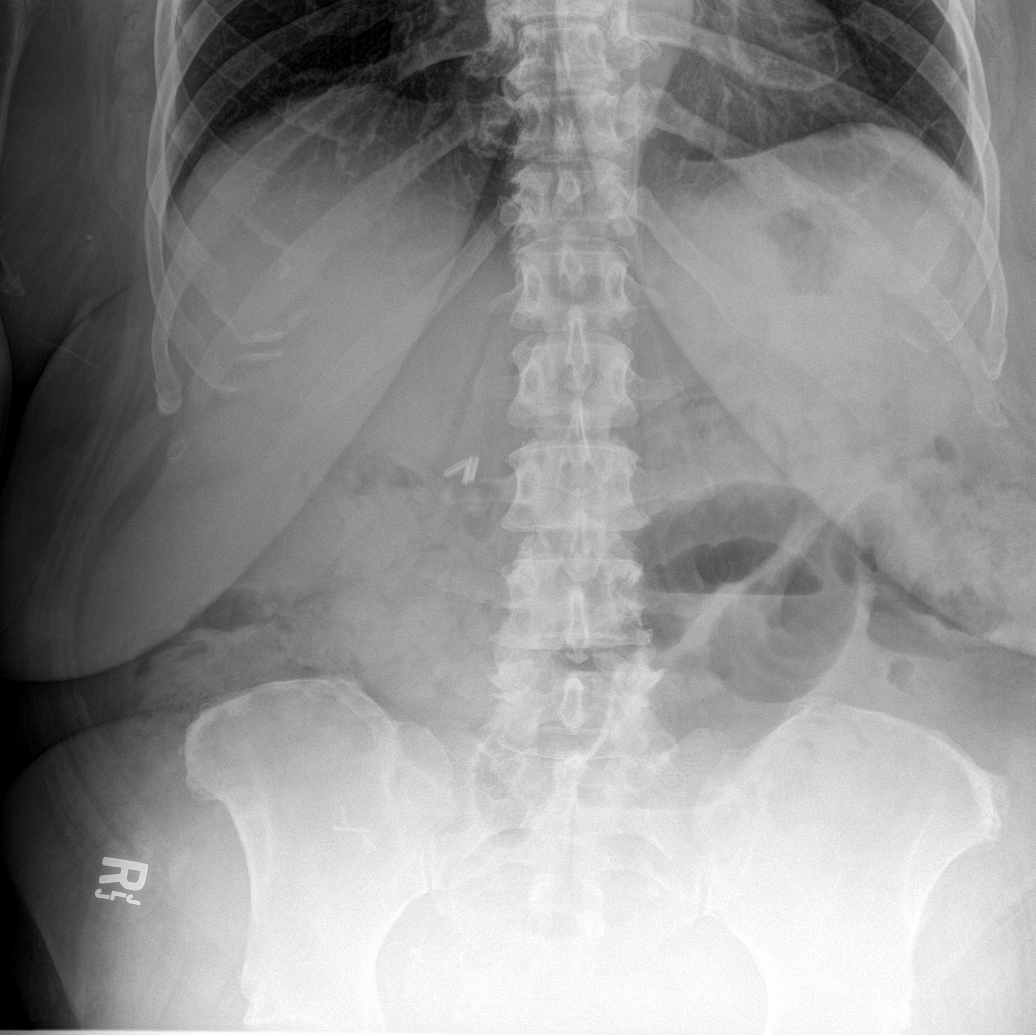

[4 of 4 positions shown; findings below may reference images not displayed]

FINDINGS: Chest x-ray: The lungs are adequately inflated and clear. The heart
and pulmonary vascularity are normal. The power port appliance
catheter tip projects over the proximal SVC. There is no pleural
effusion or pneumothorax. There is moderate degenerative change of
both shoulders.

Within the abdomen the colonic stool burden is moderate. There is
mild distention of a loop of small bowel in the mid abdomen with
several adjacent small bowel air-fluid levels. There surgical clips
in the gallbladder fossa. There are surgical clips overlying the
right iliac crest and in the right aspect of the true bony pelvis.
There is surgical suture in the mid pelvis. There are numerous
pelvic phleboliths. The lumbar spine and bony pelvis exhibit no
acute abnormalities.
IMPRESSION: 1. No acute cardiopulmonary abnormality.
2. Partial mid to distal small bowel obstruction. There is no
evidence of perforation.
3. Increased colonic stool burden consistent with constipation in
the appropriate clinical setting. This is stable.
